# Patient Record
Sex: Female | Born: 1990 | Race: Black or African American | Hispanic: No | Marital: Single | Smoking: Never smoker
Health system: Southern US, Community
[De-identification: ages and names within clinical notes are randomized; demographics above are authoritative.]

## PROBLEM LIST (undated history)

## (undated) DIAGNOSIS — Z9889 Other specified postprocedural states: Secondary | ICD-10-CM

## (undated) HISTORY — PX: ASD REPAIR: SHX258

## (undated) HISTORY — PX: BACK SURGERY: SHX140

---
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
## 2016-10-25 ENCOUNTER — Emergency Department (HOSPITAL_BASED_OUTPATIENT_CLINIC_OR_DEPARTMENT_OTHER)
Admission: EM | Admit: 2016-10-25 | Discharge: 2016-10-25 | Disposition: A | Discharge status: Home / Self Care | Payer: Federal, State, Local not specified - PPO | Attending: Emergency Medicine | Admitting: Emergency Medicine

## 2016-10-25 ENCOUNTER — Encounter (HOSPITAL_BASED_OUTPATIENT_CLINIC_OR_DEPARTMENT_OTHER): Payer: Self-pay

## 2016-10-25 DIAGNOSIS — R1084 Generalized abdominal pain: Secondary | ICD-10-CM | POA: Diagnosis not present

## 2016-10-25 DIAGNOSIS — Z5321 Procedure and treatment not carried out due to patient leaving prior to being seen by health care provider: Secondary | ICD-10-CM | POA: Insufficient documentation

## 2016-10-25 HISTORY — DX: Other specified postprocedural states: Z98.890

## 2016-10-25 NOTE — ED Triage Notes (Addendum)
Pt reports generalized abdominal pain for one hour. Menstrual cycle started today.
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
## 2017-03-25 ENCOUNTER — Emergency Department (HOSPITAL_BASED_OUTPATIENT_CLINIC_OR_DEPARTMENT_OTHER)
Admission: EM | Admit: 2017-03-25 | Discharge: 2017-03-25 | Disposition: A | Discharge status: Home / Self Care | Payer: Federal, State, Local not specified - PPO | Attending: Emergency Medicine | Admitting: Emergency Medicine

## 2017-03-25 ENCOUNTER — Encounter (HOSPITAL_BASED_OUTPATIENT_CLINIC_OR_DEPARTMENT_OTHER): Payer: Self-pay

## 2017-03-25 ENCOUNTER — Emergency Department (HOSPITAL_BASED_OUTPATIENT_CLINIC_OR_DEPARTMENT_OTHER): Payer: Federal, State, Local not specified - PPO

## 2017-03-25 DIAGNOSIS — M791 Myalgia, unspecified site: Secondary | ICD-10-CM | POA: Insufficient documentation

## 2017-03-25 LAB — PREGNANCY, URINE: Preg Test, Ur: NEGATIVE

## 2017-03-25 MED ORDER — IBUPROFEN 800 MG PO TABS: 800.0000 mg | ORAL_TABLET | Freq: Three times a day (TID) | ORAL | 0 refills | Status: AC | PRN

## 2017-03-25 MED ORDER — METHOCARBAMOL 500 MG PO TABS: 500.0000 mg | ORAL_TABLET | Freq: Two times a day (BID) | ORAL | 0 refills | Status: AC

## 2017-03-25 MED FILL — IBUPROFEN 800 MG TABLET: 800 | 7 days supply | Qty: 21 | Fill #0

## 2017-03-25 MED FILL — METHOCARBAMOL 500 MG TABLET: 500 | 10 days supply | Qty: 20 | Fill #0
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
## 2018-12-13 ENCOUNTER — Other Ambulatory Visit: Payer: Self-pay | Admitting: Cardiology

## 2018-12-13 DIAGNOSIS — Z20822 Contact with and (suspected) exposure to covid-19: Secondary | ICD-10-CM

## 2018-12-15 LAB — NOVEL CORONAVIRUS, NAA: SARS-CoV-2, NAA: NOT DETECTED
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
## 2019-08-17 ENCOUNTER — Emergency Department (HOSPITAL_BASED_OUTPATIENT_CLINIC_OR_DEPARTMENT_OTHER)
Admission: EM | Admit: 2019-08-17 | Discharge: 2019-08-17 | Disposition: A | Discharge status: Home / Self Care | Payer: Federal, State, Local not specified - PPO | Attending: Emergency Medicine | Admitting: Emergency Medicine

## 2019-08-17 ENCOUNTER — Encounter (HOSPITAL_BASED_OUTPATIENT_CLINIC_OR_DEPARTMENT_OTHER): Payer: Self-pay

## 2019-08-17 ENCOUNTER — Other Ambulatory Visit: Payer: Self-pay

## 2019-08-17 DIAGNOSIS — N10 Acute tubulo-interstitial nephritis: Secondary | ICD-10-CM | POA: Insufficient documentation

## 2019-08-17 DIAGNOSIS — N12 Tubulo-interstitial nephritis, not specified as acute or chronic: Secondary | ICD-10-CM

## 2019-08-17 LAB — COMPREHENSIVE METABOLIC PANEL
ALT: 14 U/L (ref 0–44)
AST: 17 U/L (ref 15–41)
Albumin: 4.2 g/dL (ref 3.5–5.0)
Alkaline Phosphatase: 51 U/L (ref 38–126)
Anion gap: 10 (ref 5–15)
BUN: 8 mg/dL (ref 6–20)
CO2: 23 mmol/L (ref 22–32)
Calcium: 8.8 mg/dL — ABNORMAL LOW (ref 8.9–10.3)
Chloride: 106 mmol/L (ref 98–111)
Creatinine, Ser: 0.99 mg/dL (ref 0.44–1.00)
GFR calc Af Amer: >60 mL/min (ref >60)
GFR calc non Af Amer: >60 mL/min (ref >60)
Glucose, Bld: 107 mg/dL — ABNORMAL HIGH (ref 70–99)
Potassium: 3.8 mmol/L (ref 3.5–5.1)
Sodium: 139 mmol/L (ref 135–145)
Total Bilirubin: 0.9 mg/dL (ref 0.3–1.2)
Total Protein: 7.9 g/dL (ref 6.5–8.1)

## 2019-08-17 LAB — URINALYSIS, ROUTINE W REFLEX MICROSCOPIC
Bilirubin Urine: NEGATIVE
Glucose, UA: NEGATIVE mg/dL
Ketones, ur: 15 mg/dL — AB
Nitrite: POSITIVE — AB
Protein, ur: NEGATIVE mg/dL
Specific Gravity, Urine: 1.02 (ref 1.005–1.030)
pH: 7.5 (ref 5.0–8.0)

## 2019-08-17 LAB — LIPASE, BLOOD: Lipase: 24 U/L (ref 11–51)

## 2019-08-17 LAB — URINALYSIS, MICROSCOPIC (REFLEX): WBC, UA: >50 WBC/hpf (ref 0–5)

## 2019-08-17 LAB — CBC
HCT: 36.3 % (ref 36.0–46.0)
Hemoglobin: 12.5 g/dL (ref 12.0–15.0)
MCH: 31.3 pg (ref 26.0–34.0)
MCHC: 34.4 g/dL (ref 30.0–36.0)
MCV: 90.8 fL (ref 80.0–100.0)
Platelets: 235 10*3/uL (ref 150–400)
RBC: 4 MIL/uL (ref 3.87–5.11)
RDW: 11.6 % (ref 11.5–15.5)
WBC: 12.4 10*3/uL — ABNORMAL HIGH (ref 4.0–10.5)
nRBC: 0 % (ref 0.0–0.2)

## 2019-08-17 LAB — PREGNANCY, URINE: Preg Test, Ur: NEGATIVE

## 2019-08-17 MED ORDER — ONDANSETRON 4 MG PO TBDP: 4.0000 mg | ORAL_TABLET | Freq: Three times a day (TID) | ORAL | 0 refills | Status: AC | PRN

## 2019-08-17 MED ORDER — SODIUM CHLORIDE 0.9 % IV BOLUS
1000.0000 mL | Freq: Once | INTRAVENOUS | Status: AC
  Administered 2019-08-17: 20:00:00 1000 mL via INTRAVENOUS

## 2019-08-17 MED ORDER — ACETAMINOPHEN 325 MG PO TABS
650.0000 mg | ORAL_TABLET | Freq: Once | ORAL | Status: AC
  Administered 2019-08-17: 20:00:00 650 mg via ORAL
  Filled 2019-08-17: qty 2

## 2019-08-17 MED ORDER — SODIUM CHLORIDE 0.9% FLUSH
3.0000 mL | Freq: Once | INTRAVENOUS | Status: DC
  Filled 2019-08-17: qty 3

## 2019-08-17 MED ORDER — MORPHINE SULFATE (PF) 4 MG/ML IV SOLN
4.0000 mg | Freq: Once | INTRAVENOUS | Status: AC
  Administered 2019-08-17: 20:00:00 4 mg via INTRAVENOUS
  Filled 2019-08-17: qty 1

## 2019-08-17 MED ORDER — SODIUM CHLORIDE 0.9 % IV SOLN
1.0000 g | Freq: Once | INTRAVENOUS | Status: AC
  Administered 2019-08-17: 20:00:00 1 g via INTRAVENOUS
  Filled 2019-08-17: qty 10

## 2019-08-17 MED ORDER — SODIUM CHLORIDE 0.9 % IV SOLN
INTRAVENOUS | Status: DC | PRN
  Administered 2019-08-17: 20:00:00 250 mL via INTRAVENOUS

## 2019-08-17 MED ORDER — ONDANSETRON HCL 4 MG/2ML IJ SOLN
4.0000 mg | Freq: Once | INTRAMUSCULAR | Status: AC
  Administered 2019-08-17: 20:00:00 4 mg via INTRAVENOUS
  Filled 2019-08-17: qty 2

## 2019-08-17 MED ORDER — SULFAMETHOXAZOLE-TRIMETHOPRIM 800-160 MG PO TABS: 1.0000 | ORAL_TABLET | Freq: Two times a day (BID) | ORAL | 0 refills | Status: AC

## 2019-08-17 NOTE — Discharge Instructions (Signed)
Take the antibiotics as directed.  We will contact you with the results of your urine culture if you need to be on a different antibiotic. Otherwise complete the entire course of antibiotic regardless of symptom improvement prevent worsening or recurrence of your infection. You can continue taking Tylenol as needed for fever pain. Take the Zofran as needed for nausea or vomiting. Return to the ER if you develop worsening pain, persistent fever, chest pain, shortness of breath or abdominal pain.

## 2019-08-17 NOTE — ED Provider Notes (Signed)
[facility] EMERGENCY DEPARTMENT Provider Note   CSN: [id number] Arrival date & time: 08/17/19  1705     History Chief Complaint  Patient presents with  . Abdominal Pain    Brandi Silva is a 29 y.o. female who presents to ED with a chief complaint of lower abdominal pain, nausea, vomiting and one episode of diarrhea.  Reports symptoms started about 15 hours ago after eating pizza that she is concerned was not fully cooked.  She reports history of back pain at baseline secondary to her prior back surgeries but states that this feels worse.  She last took Aleve several hours ago.  She reports pressure sensation with urination as well.  Denies any bloody stools or vomiting.  No sick contacts with similar symptoms.  Denies any prior abdominal surgeries, vaginal discharge, pelvic pain, chest pain, shortness of breath, cough or known Covid exposures.  No history of pyelonephritis.  HPI     Past Medical History:  Diagnosis Date  . H/O heart surgery   . History of back surgery     There are no problems to display for this patient.   Past Surgical History:  Procedure Laterality Date  . BACK SURGERY       OB History   No obstetric history on file.     No family history on file.  Social History   Tobacco Use  . Smoking status: Never Smoker  . Smokeless tobacco: Never Used  Vaping Use  . Vaping Use: Never used  Substance Use Topics  . Alcohol use: Yes    Comment: occ  . Drug use: Not Currently    Types: Marijuana    Home Medications Prior to Admission medications   Medication Sig Start Date End Date Taking? Authorizing Provider  ibuprofen (ADVIL,MOTRIN) 800 MG tablet Take 1 tablet (800 mg total) by mouth every 8 (eight) hours as needed. 03/25/17   Ward, Chase Picket, PA-C  methocarbamol (ROBAXIN) 500 MG tablet Take 1 tablet (500 mg total) by mouth 2 (two) times daily. 03/25/17   Ward, Chase Picket, PA-C  ondansetron (ZOFRAN ODT) 4 MG disintegrating tablet  Take 1 tablet (4 mg total) by mouth every 8 (eight) hours as needed for nausea or vomiting. 08/17/19   Laurianne Floresca, PA-C  sulfamethoxazole-trimethoprim (BACTRIM DS) 800-160 MG tablet Take 1 tablet by mouth 2 (two) times daily for 10 days. 08/17/19 08/27/19  Dietrich Pates, PA-C    Allergies    Patient has no known allergies.  Review of Systems   Review of Systems  Constitutional: Negative for appetite change, chills and fever.  HENT: Negative for ear pain, rhinorrhea, sneezing and sore throat.   Eyes: Negative for photophobia and visual disturbance.  Respiratory: Negative for cough, chest tightness, shortness of breath and wheezing.   Cardiovascular: Negative for chest pain and palpitations.  Gastrointestinal: Positive for abdominal pain, diarrhea, nausea and vomiting. Negative for blood in stool and constipation.  Genitourinary: Positive for dysuria. Negative for hematuria and urgency.  Musculoskeletal: Positive for back pain. Negative for myalgias.  Skin: Negative for rash.  Neurological: Negative for dizziness, weakness and light-headedness.    Physical Exam Updated Vital Signs BP 121/71   Pulse 81   Temp 99.4 F (37.4 C) (Oral)   Resp 18   Ht 5\' 5"  (1.651 m)   Wt 68.9 kg   SpO2 100%   BMI 25.29 kg/m   Physical Exam Vitals and nursing note reviewed.  Constitutional:      General:  She is not in acute distress.    Appearance: She is well-developed.  HENT:     Head: Normocephalic and atraumatic.     Nose: Nose normal.  Eyes:     General: No scleral icterus.       Left eye: No discharge.     Conjunctiva/sclera: Conjunctivae normal.  Cardiovascular:     Rate and Rhythm: Regular rhythm. Tachycardia present.     Heart sounds: Normal heart sounds. No murmur heard.  No friction rub. No gallop.   Pulmonary:     Effort: Pulmonary effort is normal. No respiratory distress.     Breath sounds: Normal breath sounds.  Abdominal:     General: Bowel sounds are normal. There is no  distension.     Palpations: Abdomen is soft.     Tenderness: There is abdominal tenderness in the suprapubic area. There is right CVA tenderness. There is no left CVA tenderness or guarding.  Musculoskeletal:        General: Normal range of motion.     Cervical back: Normal range of motion and neck supple.  Skin:    General: Skin is warm and dry.     Findings: No rash.  Neurological:     Mental Status: She is alert.     Motor: No abnormal muscle tone.     Coordination: Coordination normal.     ED Results / Procedures / Treatments   Labs (all labs ordered are listed, but only abnormal results are displayed) Labs Reviewed  COMPREHENSIVE METABOLIC PANEL - Abnormal; Notable for the following components:      Result Value   Glucose, Bld 107 (*)    Calcium 8.8 (*)    All other components within normal limits  CBC - Abnormal; Notable for the following components:   WBC 12.4 (*)    All other components within normal limits  URINALYSIS, ROUTINE W REFLEX MICROSCOPIC - Abnormal; Notable for the following components:   APPearance HAZY (*)    Hgb urine dipstick TRACE (*)    Ketones, ur 15 (*)    Nitrite POSITIVE (*)    Leukocytes,Ua MODERATE (*)    All other components within normal limits  URINALYSIS, MICROSCOPIC (REFLEX) - Abnormal; Notable for the following components:   Bacteria, UA MANY (*)    All other components within normal limits  URINE CULTURE  LIPASE, BLOOD  PREGNANCY, URINE    EKG None  Radiology No results found.  Procedures Procedures (including critical care time)  Medications Ordered in ED Medications  sodium chloride flush (NS) 0.9 % injection 3 mL (3 mLs Intravenous Not Given 08/17/19 1759)  0.9 %  sodium chloride infusion (250 mLs Intravenous New Bag/Given 08/17/19 2017)  sodium chloride 0.9 % bolus 1,000 mL (1,000 mLs Intravenous New Bag/Given 08/17/19 2007)  morphine 4 MG/ML injection 4 mg (4 mg Intravenous Given 08/17/19 2010)  acetaminophen (TYLENOL)  tablet 650 mg (650 mg Oral Given 08/17/19 1953)  cefTRIAXone (ROCEPHIN) 1 g in sodium chloride 0.9 % 100 mL IVPB (1 g Intravenous New Bag/Given 08/17/19 2019)  ondansetron (ZOFRAN) injection 4 mg (4 mg Intravenous Given 08/17/19 2008)    ED Course  I have reviewed the triage vital signs and the nursing notes.  Pertinent labs & imaging results that were available during my care of the patient were reviewed by me and considered in my medical decision making (see chart for details).    MDM Rules/Calculators/A&P  29 year old female presenting to the ED with chief complaint of lower abdominal pain, nausea, vomiting and one episode of diarrhea.  Also reports back pain.  She is concerned that she ate a bad batch of pizza last night.  Minimal improvement noted with Aleve.  Reports pressure sensation with urination.  Denies any pelvic or vaginal complaints.  On exam there is tenderness palpation of the right CVA and suprapubic area.  Patient initially tachycardic and febrile to 100.3.  Work-up here significant for leukocytosis of 12, urinalysis with positive nitrites, leukocytes and many bacteria.  This was sent for culture.  CMP, lipase unremarkable.  Pregnancy test is negative.  Patient given IV fluids, morphine, Rocephin and Tylenol here with improvement in her symptoms.  She is now afebrile and tachycardia has improved with defervescence.  Suspect that her fever and symptoms are in the setting of pyelonephritis.  Will treat with remainder of antibiotic course and have her follow-up with PCP.  At this time there is no focal right lower quadrant tenderness that would concern me for appendicitis or other surgical cause.  Patient is agreeable to the plan.  Will return for worsening symptoms.   Patient is hemodynamically stable, in NAD, and able to ambulate in the ED. Evaluation does not show pathology that would require ongoing emergent intervention or inpatient treatment. I explained  the diagnosis to the patient. Pain has been managed and has no complaints prior to discharge. Patient is comfortable with above plan and is stable for discharge at this time. All questions were answered prior to disposition. Strict return precautions for returning to the ED were discussed. Encouraged follow up with PCP.   An After Visit Summary was printed and given to the patient.   Portions of this note were generated with Scientist, clinical (histocompatibility and immunogenetics). Dictation errors may occur despite best attempts at proofreading.  Final Clinical Impression(s) / ED Diagnoses Final diagnoses:  Pyelonephritis    Rx / DC Orders ED Discharge Orders         Ordered    sulfamethoxazole-trimethoprim (BACTRIM DS) 800-160 MG tablet  2 times daily     Discontinue  Reprint     08/17/19 2117    ondansetron (ZOFRAN ODT) 4 MG disintegrating tablet  Every 8 hours PRN     Discontinue  Reprint     08/17/19 2117           Dietrich Pates, PA-C 08/17/19 2120    Virgina Norfolk, DO 08/17/19 2138

## 2019-08-17 NOTE — ED Triage Notes (Signed)
Pt c/o abd pain n/v/d, dizziness-sx started at 4am after eating pizza-NAD-steady gait

## 2019-08-18 ENCOUNTER — Ambulatory Visit: Payer: Self-pay | Admitting: *Deleted

## 2019-08-18 NOTE — Telephone Encounter (Signed)
  C/o increased pain in lower abdomen, numbness in legs and hands, and reports dizziness. Temp 102.5 15 minutes prior to call.pateint took tylenol. Rechecked temp for 98.4 now. Recent discharge from [facility] ED yesterday and continues to have pain. . Denies difficulty breathing , chest pain. Reports kidney infection and she was not given pain medication. Instructed patient to go to urgent care or back to ED since pain was rated severe. Care advise given. Patient verbalized understanding of care advise. Instructed patient to go to urgent care or ED if symptoms worsen.  Reason for Disposition . [1] SEVERE pain AND [2] present < 1 hour  Answer Assessment - Initial Assessment Questions 1. LOCATION: "Where does it hurt?"      Lower abdomen  2. RADIATION: "Does the pain shoot anywhere else?" (e.g., chest, back)     Shoots to my back 3. ONSET: "When did the pain begin?" (e.g., minutes, hours or days ago)      This am 4. SUDDEN: "Gradual or sudden onset?"     Sudden onset  5. PATTERN "Does the pain come and go, or is it constant?"    - If constant: "Is it getting better, staying the same, or worsening?"      (Note: Constant means the pain never goes away completely; most serious pain is constant and it progresses)     - If intermittent: "How long does it last?" "Do you have pain now?"     (Note: Intermittent means the pain goes away completely between bouts)     Lasts all of the time  6. SEVERITY: "How bad is the pain?"  (e.g., Scale 1-10; mild, moderate, or severe)   - MILD (1-3): doesn't interfere with normal activities, abdomen soft and not tender to touch    - MODERATE (4-7): interferes with normal activities or awakens from sleep, tender to touch    - SEVERE (8-10): excruciating pain, doubled over, unable to do any normal activities      severe 7. RECURRENT SYMPTOM: "Have you ever had this type of stomach pain before?" If Yes, ask: "When was the last time?" and "What happened that time?"       Yes . Kidney infection 8. CAUSE: "What do you think is causing the stomach pain?"     na 9. RELIEVING/AGGRAVATING FACTORS: "What makes it better or worse?" (e.g., movement, antacids, bowel movement)     na 10. OTHER SYMPTOMS: "Has there been any vomiting, diarrhea, constipation, or urine problems?"       na 11. PREGNANCY: "Is there any chance you are pregnant?" "When was your last menstrual period?"       No July 21st  Protocols used: ABDOMINAL PAIN [facility]

## 2019-08-20 LAB — URINE CULTURE: Culture: >=100000 — AB

## 2019-08-21 ENCOUNTER — Telehealth: Payer: Self-pay

## 2019-08-21 NOTE — Telephone Encounter (Signed)
Post ED Visit - Positive Culture Follow-up  Culture report reviewed by antimicrobial stewardship pharmacist: [facility] []  , Pharm.D. [x]  Enzo Bi, Pharm.D., BCPS AQ-ID []  , Pharm.D., BCPS []  Celedonio Miyamoto, .D., BCPS []  [place], .D., BCPS, AAHIVP []  Georgina Pillion, Pharm.D., BCPS, AAHIVP []  [address], PharmD, BCPS []  , PharmD, BCPS []  [place], PharmD, BCPS []  [address], PharmD []  , PharmD, BCPS []  Estella Husk, PharmD  Pharmacy Team []  Lysle Pearl, PharmD []  , PharmD []  Phillips Climes, PharmD []  , Rph []  Agapito Games) , PharmD []  Verlan Friends, PharmD []  , PharmD []  Mervyn Gay, PharmD []  , PharmD []  Vinnie Level, PharmD []  [facility], PharmD []  , PharmD []  Len Childs, PharmD   Positive urine culture Treated with Bactrim DS, organism sensitive to the same and no further patient follow-up is required at this time.  08/21/2019, 8:55 AM

## 2021-01-18 ENCOUNTER — Encounter (HOSPITAL_BASED_OUTPATIENT_CLINIC_OR_DEPARTMENT_OTHER): Payer: Self-pay | Admitting: Emergency Medicine

## 2021-01-18 ENCOUNTER — Emergency Department (HOSPITAL_BASED_OUTPATIENT_CLINIC_OR_DEPARTMENT_OTHER)
Admission: EM | Admit: 2021-01-18 | Discharge: 2021-01-18 | Disposition: A | Discharge status: Home / Self Care | Payer: Self-pay | Attending: Emergency Medicine | Admitting: Emergency Medicine

## 2021-01-18 ENCOUNTER — Other Ambulatory Visit: Payer: Self-pay

## 2021-01-18 DIAGNOSIS — R0602 Shortness of breath: Secondary | ICD-10-CM | POA: Insufficient documentation

## 2021-01-18 DIAGNOSIS — R202 Paresthesia of skin: Secondary | ICD-10-CM | POA: Insufficient documentation

## 2021-01-18 DIAGNOSIS — K5900 Constipation, unspecified: Secondary | ICD-10-CM | POA: Insufficient documentation

## 2021-01-18 DIAGNOSIS — Z5321 Procedure and treatment not carried out due to patient leaving prior to being seen by health care provider: Secondary | ICD-10-CM | POA: Insufficient documentation

## 2021-01-18 DIAGNOSIS — J029 Acute pharyngitis, unspecified: Secondary | ICD-10-CM | POA: Insufficient documentation

## 2021-01-18 LAB — COMPREHENSIVE METABOLIC PANEL
ALT: 13 U/L (ref 0–44)
AST: 19 U/L (ref 15–41)
Albumin: 4.5 g/dL (ref 3.5–5.0)
Alkaline Phosphatase: 55 U/L (ref 38–126)
Anion gap: 13 (ref 5–15)
BUN: 10 mg/dL (ref 6–20)
CO2: 16 mmol/L — ABNORMAL LOW (ref 22–32)
Calcium: 9.1 mg/dL (ref 8.9–10.3)
Chloride: 108 mmol/L (ref 98–111)
Creatinine, Ser: 0.82 mg/dL (ref 0.44–1.00)
GFR, Estimated: >60 mL/min (ref >60)
Glucose, Bld: 102 mg/dL — ABNORMAL HIGH (ref 70–99)
Potassium: 2.9 mmol/L — ABNORMAL LOW (ref 3.5–5.1)
Sodium: 137 mmol/L (ref 135–145)
Total Bilirubin: 0.9 mg/dL (ref 0.3–1.2)
Total Protein: 8.2 g/dL — ABNORMAL HIGH (ref 6.5–8.1)

## 2021-01-18 LAB — CBC
HCT: 37.9 % (ref 36.0–46.0)
Hemoglobin: 13.6 g/dL (ref 12.0–15.0)
MCH: 31.1 pg (ref 26.0–34.0)
MCHC: 35.9 g/dL (ref 30.0–36.0)
MCV: 86.5 fL (ref 80.0–100.0)
Platelets: 217 10*3/uL (ref 150–400)
RBC: 4.38 MIL/uL (ref 3.87–5.11)
RDW: 11.5 % (ref 11.5–15.5)
WBC: 4.7 10*3/uL (ref 4.0–10.5)
nRBC: 0 % (ref 0.0–0.2)

## 2021-01-18 LAB — LIPASE, BLOOD: Lipase: 25 U/L (ref 11–51)

## 2021-01-18 MED ORDER — ONDANSETRON 4 MG PO TBDP
4.0000 mg | ORAL_TABLET | Freq: Once | ORAL | Status: AC | PRN
  Administered 2021-01-18: 4 mg via ORAL
  Filled 2021-01-18: qty 1

## 2021-01-18 NOTE — ED Notes (Signed)
Called for x 3 in the lobby with no answer

## 2021-01-18 NOTE — ED Triage Notes (Addendum)
Pt c/o shortness of breath, sore throat, constipation, numbness and tingling to hands and feet. Pt hyperventilating in triage.Temp 101.0, ibuprofen last dose prior to arrival.

## 2022-03-10 ENCOUNTER — Telehealth: Payer: Self-pay

## 2022-03-10 NOTE — Telephone Encounter (Signed)
Mychart msg sent. AS, CMA

## 2022-09-16 ENCOUNTER — Ambulatory Visit: Payer: BC Managed Care – PPO | Admitting: Nurse Practitioner

## 2023-09-02 DIAGNOSIS — Z6826 Body mass index (BMI) 26.0-26.9, adult: Secondary | ICD-10-CM | POA: Diagnosis not present

## 2023-09-02 DIAGNOSIS — Z1159 Encounter for screening for other viral diseases: Secondary | ICD-10-CM | POA: Diagnosis not present

## 2023-09-02 DIAGNOSIS — N6322 Unspecified lump in the left breast, upper inner quadrant: Secondary | ICD-10-CM | POA: Diagnosis not present

## 2023-09-02 DIAGNOSIS — Z7251 High risk heterosexual behavior: Secondary | ICD-10-CM | POA: Diagnosis not present

## 2023-09-02 DIAGNOSIS — Z202 Contact with and (suspected) exposure to infections with a predominantly sexual mode of transmission: Secondary | ICD-10-CM | POA: Diagnosis not present

## 2023-09-02 DIAGNOSIS — Z114 Encounter for screening for human immunodeficiency virus [HIV]: Secondary | ICD-10-CM | POA: Diagnosis not present

## 2024-02-24 ENCOUNTER — Emergency Department (HOSPITAL_BASED_OUTPATIENT_CLINIC_OR_DEPARTMENT_OTHER)
Admission: EM | Admit: 2024-02-24 | Discharge: 2024-02-24 | Disposition: A | Discharge status: Home / Self Care | Source: Home / Self Care | Attending: Emergency Medicine | Admitting: Emergency Medicine

## 2024-02-24 ENCOUNTER — Other Ambulatory Visit: Payer: Self-pay

## 2024-02-24 ENCOUNTER — Encounter (HOSPITAL_BASED_OUTPATIENT_CLINIC_OR_DEPARTMENT_OTHER): Payer: Self-pay

## 2024-02-24 DIAGNOSIS — M7918 Myalgia, other site: Secondary | ICD-10-CM

## 2024-02-24 LAB — PREGNANCY, URINE: Preg Test, Ur: NEGATIVE

## 2024-02-24 MED ORDER — CYCLOBENZAPRINE HCL 10 MG PO TABS: 10.0000 mg | ORAL_TABLET | Freq: Two times a day (BID) | ORAL | 0 refills | Status: AC | PRN

## 2024-02-24 MED ORDER — KETOROLAC TROMETHAMINE 30 MG/ML IJ SOLN
30.0000 mg | Freq: Once | INTRAMUSCULAR | Status: AC
  Administered 2024-02-24: 30 mg via INTRAMUSCULAR
  Filled 2024-02-24: qty 1

## 2024-02-24 MED ORDER — ACETAMINOPHEN 500 MG PO TABS
1000.0000 mg | ORAL_TABLET | Freq: Once | ORAL | Status: AC
  Administered 2024-02-24: 1000 mg via ORAL
  Filled 2024-02-24: qty 2

## 2024-02-24 MED ORDER — LIDOCAINE 5 % EX PTCH
3.0000 | MEDICATED_PATCH | CUTANEOUS | Status: DC
  Administered 2024-02-24: 3 via TRANSDERMAL
  Filled 2024-02-24: qty 3

## 2024-02-24 MED ORDER — TIZANIDINE HCL 2 MG PO TABS
4.0000 mg | ORAL_TABLET | Freq: Once | ORAL | Status: AC
  Administered 2024-02-24: 4 mg via ORAL
  Filled 2024-02-24: qty 2
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
